# Patient Record
Sex: Male | Born: 1995 | Race: White | Hispanic: Yes | Marital: Single | Smoking: Never smoker
Health system: Southern US, Community
[De-identification: ages and names within clinical notes are randomized; demographics above are authoritative.]

---

## 2021-03-10 ENCOUNTER — Encounter (HOSPITAL_COMMUNITY): Admission: EM | Disposition: A | Discharge status: Home / Self Care | Payer: Self-pay | Source: Home / Self Care

## 2021-03-10 ENCOUNTER — Emergency Department (HOSPITAL_COMMUNITY): Payer: Self-pay

## 2021-03-10 ENCOUNTER — Emergency Department (HOSPITAL_COMMUNITY): Payer: Self-pay | Admitting: Registered Nurse

## 2021-03-10 ENCOUNTER — Other Ambulatory Visit: Payer: Self-pay

## 2021-03-10 ENCOUNTER — Observation Stay (HOSPITAL_COMMUNITY)
Admission: EM | Admit: 2021-03-10 | Discharge: 2021-03-10 | Disposition: A | Discharge status: Home / Self Care | Payer: Self-pay | Attending: General Surgery | Admitting: General Surgery

## 2021-03-10 ENCOUNTER — Encounter (HOSPITAL_COMMUNITY): Payer: Self-pay | Admitting: *Deleted

## 2021-03-10 DIAGNOSIS — S0190XA Unspecified open wound of unspecified part of head, initial encounter: Secondary | ICD-10-CM | POA: Insufficient documentation

## 2021-03-10 DIAGNOSIS — Y900 Blood alcohol level of less than 20 mg/100 ml: Secondary | ICD-10-CM | POA: Insufficient documentation

## 2021-03-10 DIAGNOSIS — S61412A Laceration without foreign body of left hand, initial encounter: Secondary | ICD-10-CM | POA: Insufficient documentation

## 2021-03-10 DIAGNOSIS — S1190XA Unspecified open wound of unspecified part of neck, initial encounter: Principal | ICD-10-CM | POA: Insufficient documentation

## 2021-03-10 DIAGNOSIS — Z23 Encounter for immunization: Secondary | ICD-10-CM | POA: Insufficient documentation

## 2021-03-10 DIAGNOSIS — Z20822 Contact with and (suspected) exposure to covid-19: Secondary | ICD-10-CM | POA: Insufficient documentation

## 2021-03-10 DIAGNOSIS — T148XXA Other injury of unspecified body region, initial encounter: Secondary | ICD-10-CM

## 2021-03-10 HISTORY — PX: EXCISION MASS NECK: SHX6703

## 2021-03-10 LAB — POCT I-STAT 7, (LYTES, BLD GAS, ICA,H+H)
Acid-base deficit: 7 mmol/L — ABNORMAL HIGH (ref 0.0–2.0)
Bicarbonate: 18.2 mmol/L — ABNORMAL LOW (ref 20.0–28.0)
Calcium, Ion: 0.98 mmol/L — ABNORMAL LOW (ref 1.15–1.40)
HCT: 22 % — ABNORMAL LOW (ref 39.0–52.0)
Hemoglobin: 7.5 g/dL — ABNORMAL LOW (ref 13.0–17.0)
O2 Saturation: 100 %
Patient temperature: 35.2
Potassium: 4.1 mmol/L (ref 3.5–5.1)
Sodium: 142 mmol/L (ref 135–145)
TCO2: 19 mmol/L — ABNORMAL LOW (ref 22–32)
pCO2 arterial: 29.8 mmHg — ABNORMAL LOW (ref 32.0–48.0)
pH, Arterial: 7.386 (ref 7.350–7.450)
pO2, Arterial: 327 mmHg — ABNORMAL HIGH (ref 83.0–108.0)

## 2021-03-10 LAB — CBC
HCT: 40.8 % (ref 39.0–52.0)
HCT: 36.3 % — ABNORMAL LOW (ref 39.0–52.0)
HCT: 38.9 % — ABNORMAL LOW (ref 39.0–52.0)
Hemoglobin: 13.9 g/dL (ref 13.0–17.0)
Hemoglobin: 12.6 g/dL — ABNORMAL LOW (ref 13.0–17.0)
Hemoglobin: 13.9 g/dL (ref 13.0–17.0)
MCH: 31 pg (ref 26.0–34.0)
MCH: 31.6 pg (ref 26.0–34.0)
MCH: 31.5 pg (ref 26.0–34.0)
MCHC: 34.1 g/dL (ref 30.0–36.0)
MCHC: 34.7 g/dL (ref 30.0–36.0)
MCHC: 35.7 g/dL (ref 30.0–36.0)
MCV: 90.9 fL (ref 80.0–100.0)
MCV: 91 fL (ref 80.0–100.0)
MCV: 88.2 fL (ref 80.0–100.0)
Platelets: 284 10*3/uL (ref 150–400)
Platelets: 182 10*3/uL (ref 150–400)
Platelets: 205 10*3/uL (ref 150–400)
RBC: 4.49 MIL/uL (ref 4.22–5.81)
RBC: 3.99 MIL/uL — ABNORMAL LOW (ref 4.22–5.81)
RBC: 4.41 MIL/uL (ref 4.22–5.81)
RDW: 11.6 % (ref 11.5–15.5)
RDW: 12 % (ref 11.5–15.5)
RDW: 12 % (ref 11.5–15.5)
WBC: 11 10*3/uL — ABNORMAL HIGH (ref 4.0–10.5)
WBC: 17.8 10*3/uL — ABNORMAL HIGH (ref 4.0–10.5)
WBC: 13.5 10*3/uL — ABNORMAL HIGH (ref 4.0–10.5)
nRBC: 0 % (ref 0.0–0.2)
nRBC: 0 % (ref 0.0–0.2)
nRBC: 0 % (ref 0.0–0.2)

## 2021-03-10 LAB — COMPREHENSIVE METABOLIC PANEL
ALT: 14 U/L (ref 0–44)
AST: 22 U/L (ref 15–41)
Albumin: 4 g/dL (ref 3.5–5.0)
Alkaline Phosphatase: 70 U/L (ref 38–126)
Anion gap: 16 — ABNORMAL HIGH (ref 5–15)
BUN: 8 mg/dL (ref 6–20)
CO2: 17 mmol/L — ABNORMAL LOW (ref 22–32)
Calcium: 8.4 mg/dL — ABNORMAL LOW (ref 8.9–10.3)
Chloride: 107 mmol/L (ref 98–111)
Creatinine, Ser: 0.98 mg/dL (ref 0.61–1.24)
GFR, Estimated: >60 mL/min (ref >60)
Glucose, Bld: 164 mg/dL — ABNORMAL HIGH (ref 70–99)
Potassium: 3.2 mmol/L — ABNORMAL LOW (ref 3.5–5.1)
Sodium: 140 mmol/L (ref 135–145)
Total Bilirubin: 0.7 mg/dL (ref 0.3–1.2)
Total Protein: 6.9 g/dL (ref 6.5–8.1)

## 2021-03-10 LAB — TRAUMA TEG PANEL
CFF Max Amplitude: 19.6 mm (ref 15–32)
Citrated Kaolin (R): 3.9 min — ABNORMAL LOW (ref 4.6–9.1)
Citrated Rapid TEG (MA): 60.6 mm (ref 52–70)
Lysis at 30 Minutes: 0.1 % (ref 0.0–2.6)

## 2021-03-10 LAB — RESP PANEL BY RT-PCR (FLU A&B, COVID) ARPGX2
Influenza A by PCR: NEGATIVE
Influenza B by PCR: NEGATIVE
SARS Coronavirus 2 by RT PCR: NEGATIVE

## 2021-03-10 LAB — ABO/RH: ABO/RH(D): O POS

## 2021-03-10 LAB — I-STAT CHEM 8, ED
BUN: 8 mg/dL (ref 6–20)
Calcium, Ion: 1.01 mmol/L — ABNORMAL LOW (ref 1.15–1.40)
Chloride: 104 mmol/L (ref 98–111)
Creatinine, Ser: 1.1 mg/dL (ref 0.61–1.24)
Glucose, Bld: 164 mg/dL — ABNORMAL HIGH (ref 70–99)
HCT: 41 % (ref 39.0–52.0)
Hemoglobin: 13.9 g/dL (ref 13.0–17.0)
Potassium: 3.1 mmol/L — ABNORMAL LOW (ref 3.5–5.1)
Sodium: 141 mmol/L (ref 135–145)
TCO2: 21 mmol/L — ABNORMAL LOW (ref 22–32)

## 2021-03-10 LAB — SAMPLE TO BLOOD BANK

## 2021-03-10 LAB — PROTIME-INR
INR: 1.2 (ref 0.8–1.2)
Prothrombin Time: 15.4 seconds — ABNORMAL HIGH (ref 11.4–15.2)

## 2021-03-10 LAB — HIV ANTIBODY (ROUTINE TESTING W REFLEX): HIV Screen 4th Generation wRfx: NONREACTIVE

## 2021-03-10 LAB — LACTIC ACID, PLASMA: Lactic Acid, Venous: 3 mmol/L (ref 0.5–1.9)

## 2021-03-10 LAB — CREATININE, SERUM
Creatinine, Ser: 0.79 mg/dL (ref 0.61–1.24)
GFR, Estimated: >60 mL/min (ref >60)

## 2021-03-10 LAB — ETHANOL: Alcohol, Ethyl (B): 317 mg/dL (ref <10)

## 2021-03-10 SURGERY — EXCISION, MASS, NECK
Anesthesia: General | Site: Neck | Laterality: Left

## 2021-03-10 MED ORDER — LACTATED RINGERS IV SOLN: INTRAVENOUS | Status: DC | PRN

## 2021-03-10 MED ORDER — LIDOCAINE 2% (20 MG/ML) 5 ML SYRINGE
INTRAMUSCULAR | Status: AC
  Filled 2021-03-10: qty 5

## 2021-03-10 MED ORDER — MORPHINE SULFATE (PF) 2 MG/ML IV SOLN: 2.0000 mg | INTRAVENOUS | Status: DC | PRN

## 2021-03-10 MED ORDER — PROPOFOL 10 MG/ML IV BOLUS
INTRAVENOUS | Status: AC
  Filled 2021-03-10: qty 20

## 2021-03-10 MED ORDER — BACITRACIN-NEOMYCIN-POLYMYXIN OINTMENT TUBE
TOPICAL_OINTMENT | CUTANEOUS | Status: AC
  Filled 2021-03-10: qty 14.17

## 2021-03-10 MED ORDER — ROCURONIUM BROMIDE 10 MG/ML (PF) SYRINGE
PREFILLED_SYRINGE | INTRAVENOUS | Status: AC
  Filled 2021-03-10: qty 10

## 2021-03-10 MED ORDER — FENTANYL CITRATE (PF) 250 MCG/5ML IJ SOLN
INTRAMUSCULAR | Status: DC | PRN
  Administered 2021-03-10: 100 ug via INTRAVENOUS
  Administered 2021-03-10: 50 ug via INTRAVENOUS

## 2021-03-10 MED ORDER — PHENYLEPHRINE 40 MCG/ML (10ML) SYRINGE FOR IV PUSH (FOR BLOOD PRESSURE SUPPORT)
PREFILLED_SYRINGE | INTRAVENOUS | Status: AC
  Filled 2021-03-10: qty 10

## 2021-03-10 MED ORDER — FENTANYL CITRATE (PF) 250 MCG/5ML IJ SOLN
INTRAMUSCULAR | Status: AC
  Filled 2021-03-10: qty 5

## 2021-03-10 MED ORDER — IOHEXOL 350 MG/ML SOLN
75.0000 mL | Freq: Once | INTRAVENOUS | Status: AC | PRN
  Administered 2021-03-10: 75 mL via INTRAVENOUS

## 2021-03-10 MED ORDER — SUCCINYLCHOLINE CHLORIDE 200 MG/10ML IV SOSY
PREFILLED_SYRINGE | INTRAVENOUS | Status: DC | PRN
  Administered 2021-03-10: 120 mg via INTRAVENOUS

## 2021-03-10 MED ORDER — DOCUSATE SODIUM 100 MG PO CAPS: 100.0000 mg | ORAL_CAPSULE | Freq: Two times a day (BID) | ORAL | Status: DC

## 2021-03-10 MED ORDER — SODIUM CHLORIDE 0.9 % IV SOLN: INTRAVENOUS | Status: DC | PRN

## 2021-03-10 MED ORDER — LIDOCAINE 2% (20 MG/ML) 5 ML SYRINGE
INTRAMUSCULAR | Status: DC | PRN
  Administered 2021-03-10: 60 mg via INTRAVENOUS

## 2021-03-10 MED ORDER — DEXAMETHASONE SODIUM PHOSPHATE 10 MG/ML IJ SOLN
INTRAMUSCULAR | Status: DC | PRN
  Administered 2021-03-10: 10 mg via INTRAVENOUS

## 2021-03-10 MED ORDER — BACITRACIN-NEOMYCIN-POLYMYXIN OINTMENT TUBE
TOPICAL_OINTMENT | CUTANEOUS | Status: DC | PRN
  Administered 2021-03-10: 1 via TOPICAL

## 2021-03-10 MED ORDER — SUCCINYLCHOLINE CHLORIDE 200 MG/10ML IV SOSY
PREFILLED_SYRINGE | INTRAVENOUS | Status: AC
  Filled 2021-03-10: qty 10

## 2021-03-10 MED ORDER — PHENYLEPHRINE 40 MCG/ML (10ML) SYRINGE FOR IV PUSH (FOR BLOOD PRESSURE SUPPORT)
PREFILLED_SYRINGE | INTRAVENOUS | Status: DC | PRN
  Administered 2021-03-10 (×8): 80 ug via INTRAVENOUS

## 2021-03-10 MED ORDER — ALBUMIN HUMAN 5 % IV SOLN: INTRAVENOUS | Status: DC | PRN

## 2021-03-10 MED ORDER — 0.9 % SODIUM CHLORIDE (POUR BTL) OPTIME
TOPICAL | Status: DC | PRN
  Administered 2021-03-10: 1000 mL

## 2021-03-10 MED ORDER — TETANUS-DIPHTH-ACELL PERTUSSIS 5-2.5-18.5 LF-MCG/0.5 IM SUSY
0.5000 mL | PREFILLED_SYRINGE | Freq: Once | INTRAMUSCULAR | Status: AC
  Administered 2021-03-10: 0.5 mL via INTRAMUSCULAR

## 2021-03-10 MED ORDER — CEFAZOLIN SODIUM-DEXTROSE 2-4 GM/100ML-% IV SOLN
2.0000 g | Freq: Once | INTRAVENOUS | Status: AC
  Administered 2021-03-10: 2 g via INTRAVENOUS
  Filled 2021-03-10: qty 100

## 2021-03-10 MED ORDER — PROPOFOL 10 MG/ML IV BOLUS
INTRAVENOUS | Status: DC | PRN
  Administered 2021-03-10: 150 mg via INTRAVENOUS

## 2021-03-10 MED ORDER — ONDANSETRON HCL 4 MG/2ML IJ SOLN
INTRAMUSCULAR | Status: DC | PRN
  Administered 2021-03-10: 4 mg via INTRAVENOUS

## 2021-03-10 MED ORDER — ACETAMINOPHEN 325 MG PO TABS: 650.0000 mg | ORAL_TABLET | ORAL | Status: DC | PRN

## 2021-03-10 MED ORDER — DEXTROSE-NACL 5-0.45 % IV SOLN: INTRAVENOUS | Status: DC

## 2021-03-10 MED ORDER — PROMETHAZINE HCL 25 MG/ML IJ SOLN: 6.2500 mg | INTRAMUSCULAR | Status: DC | PRN

## 2021-03-10 MED ORDER — HYDROMORPHONE HCL 1 MG/ML IJ SOLN: 0.2500 mg | INTRAMUSCULAR | Status: DC | PRN

## 2021-03-10 MED ORDER — MIDAZOLAM HCL 2 MG/2ML IJ SOLN
INTRAMUSCULAR | Status: AC
  Filled 2021-03-10: qty 2

## 2021-03-10 MED ORDER — ROCURONIUM BROMIDE 10 MG/ML (PF) SYRINGE
PREFILLED_SYRINGE | INTRAVENOUS | Status: DC | PRN
  Administered 2021-03-10: 50 mg via INTRAVENOUS

## 2021-03-10 MED ORDER — SUGAMMADEX SODIUM 200 MG/2ML IV SOLN
INTRAVENOUS | Status: DC | PRN
  Administered 2021-03-10: 200 mg via INTRAVENOUS
  Administered 2021-03-10: 100 mg via INTRAVENOUS

## 2021-03-10 MED ORDER — MEPERIDINE HCL 25 MG/ML IJ SOLN: 6.2500 mg | INTRAMUSCULAR | Status: DC | PRN

## 2021-03-10 MED ORDER — ENOXAPARIN SODIUM 30 MG/0.3ML IJ SOSY: 30.0000 mg | PREFILLED_SYRINGE | Freq: Two times a day (BID) | INTRAMUSCULAR | Status: DC

## 2021-03-10 MED ORDER — EPHEDRINE 5 MG/ML INJ
INTRAVENOUS | Status: AC
  Filled 2021-03-10: qty 10

## 2021-03-10 MED ORDER — ONDANSETRON HCL 4 MG/2ML IJ SOLN: 4.0000 mg | Freq: Four times a day (QID) | INTRAMUSCULAR | Status: DC | PRN

## 2021-03-10 MED ORDER — ALBUTEROL SULFATE HFA 108 (90 BASE) MCG/ACT IN AERS
INHALATION_SPRAY | RESPIRATORY_TRACT | Status: DC | PRN
  Administered 2021-03-10: 4 via RESPIRATORY_TRACT

## 2021-03-10 MED ORDER — ONDANSETRON 4 MG PO TBDP: 4.0000 mg | ORAL_TABLET | Freq: Four times a day (QID) | ORAL | Status: DC | PRN

## 2021-03-10 MED ORDER — MIDAZOLAM HCL 5 MG/5ML IJ SOLN
INTRAMUSCULAR | Status: DC | PRN
  Administered 2021-03-10: 2 mg via INTRAVENOUS

## 2021-03-10 SURGICAL SUPPLY — 45 items
BLADE CLIPPER SURG (BLADE) IMPLANT
BNDG ELASTIC 3X5.8 VLCR STR LF (GAUZE/BANDAGES/DRESSINGS) ×2 IMPLANT
BNDG ELASTIC 4X5.8 VLCR STR LF (GAUZE/BANDAGES/DRESSINGS) ×2 IMPLANT
BNDG GAUZE ELAST 4 BULKY (GAUZE/BANDAGES/DRESSINGS) ×2 IMPLANT
CHLORAPREP W/TINT 26 (MISCELLANEOUS) IMPLANT
COVER SURGICAL LIGHT HANDLE (MISCELLANEOUS) ×2 IMPLANT
COVER WAND RF STERILE (DRAPES) ×2 IMPLANT
DERMABOND ADVANCED (GAUZE/BANDAGES/DRESSINGS) ×1
DERMABOND ADVANCED .7 DNX12 (GAUZE/BANDAGES/DRESSINGS) ×1 IMPLANT
DRAPE EXTREMITY ABCS (DRAPES) ×2 IMPLANT
DRAPE HALF SHEET 40X57 (DRAPES) ×2 IMPLANT
DRAPE LAPAROTOMY 100X72 PEDS (DRAPES) ×2 IMPLANT
DRSG TEGADERM 2-3/8X2-3/4 SM (GAUZE/BANDAGES/DRESSINGS) ×2 IMPLANT
DRSG TEGADERM 4X4.75 (GAUZE/BANDAGES/DRESSINGS) ×2 IMPLANT
ELECT REM PT RETURN 9FT ADLT (ELECTROSURGICAL) ×2
ELECTRODE REM PT RTRN 9FT ADLT (ELECTROSURGICAL) ×1 IMPLANT
GAUZE SPONGE 4X4 12PLY STRL (GAUZE/BANDAGES/DRESSINGS) ×2 IMPLANT
GAUZE XEROFORM 1X8 LF (GAUZE/BANDAGES/DRESSINGS) ×2 IMPLANT
GLOVE SURG SS PI 7.0 STRL IVOR (GLOVE) ×4 IMPLANT
GLOVE SURG UNDER POLY LF SZ7 (GLOVE) ×2 IMPLANT
GOWN STRL REUS W/ TWL LRG LVL3 (GOWN DISPOSABLE) ×2 IMPLANT
GOWN STRL REUS W/TWL LRG LVL3 (GOWN DISPOSABLE) ×2
KIT BASIN OR (CUSTOM PROCEDURE TRAY) ×2 IMPLANT
KIT TURNOVER KIT B (KITS) ×2 IMPLANT
NDL HYPO 25GX1X1/2 BEV (NEEDLE) ×1 IMPLANT
NEEDLE 22X1 1/2 (OR ONLY) (NEEDLE) IMPLANT
NEEDLE HYPO 25GX1X1/2 BEV (NEEDLE) ×2 IMPLANT
NS IRRIG 1000ML POUR BTL (IV SOLUTION) ×2 IMPLANT
PACK EENT II TURBAN DRAPE (CUSTOM PROCEDURE TRAY) ×2 IMPLANT
PACK GENERAL/GYN (CUSTOM PROCEDURE TRAY) ×4 IMPLANT
PAD ARMBOARD 7.5X6 YLW CONV (MISCELLANEOUS) ×4 IMPLANT
PENCIL SMOKE EVACUATOR (MISCELLANEOUS) IMPLANT
SPECIMEN JAR SMALL (MISCELLANEOUS) ×2 IMPLANT
STAPLER VISISTAT (STAPLE) ×2 IMPLANT
SUT CHROMIC 4 0 PS 2 18 (SUTURE) ×2 IMPLANT
SUT MNCRL AB 4-0 PS2 18 (SUTURE) IMPLANT
SUT SILK 3 0 SH CR/8 (SUTURE) ×2 IMPLANT
SUT VIC AB 2-0 SH 27 (SUTURE)
SUT VIC AB 2-0 SH 27X BRD (SUTURE) IMPLANT
SUT VIC AB 3-0 SH 27 (SUTURE)
SUT VIC AB 3-0 SH 27XBRD (SUTURE) IMPLANT
SUT VIC AB 3-0 SH 8-18 (SUTURE) ×2 IMPLANT
SYR CONTROL 10ML LL (SYRINGE) IMPLANT
TOWEL GREEN STERILE (TOWEL DISPOSABLE) ×2 IMPLANT
TOWEL GREEN STERILE FF (TOWEL DISPOSABLE) ×2 IMPLANT

## 2021-03-10 NOTE — Anesthesia Procedure Notes (Signed)
Arterial Line Insertion Start/End5/14/2022 1:25 AM, 03/10/2021 1:30 AM Performed by: Zollie Scale, CRNA, CRNA  Patient location: OR. Emergency situation Right, radial was placed Catheter size: 20 G Hand hygiene performed  and Seldinger technique used Allen's test indicative of satisfactory collateral circulation Attempts: 1 Procedure performed without using ultrasound guided technique. Following insertion, dressing applied and Biopatch. Patient tolerated the procedure well with no immediate complications.
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
## 2021-03-10 NOTE — ED Notes (Signed)
bp 116/82

## 2021-03-10 NOTE — Discharge Instructions (Signed)
Use tylenol and ibuprofen for pain May shower on Sunday and get wounds wet  Herida por puncin Puncture Wound Una herida por puncin es una lesin causada por un objeto delgado y filoso que atraviesa (penetra) la piel. Por lo general, las heridas por puncin no dejan una gran abertura en la piel, por lo que es posible que no sangren mucho. Sin embargo, cuando una persona tiene una herida por puncin, la suciedad u otros materiales (cuerpos extraos) puede trasladarse al interior de la herida y desprenderse all. Esto aumenta la probabilidad de sufrir una infeccin, como el ttanos. Existen muchos objetos filosos y puntiagudos que pueden causar heridas por puncin, como dientes, uas, esquirlas de vidrio, anzuelos y [place]. El tratamiento puede incluir lavar la herida con un una solucin de agua y sal libre de grmenes (estril), abrirla quirrgicamente para retirar un objeto extrao, cerrarla con puntos (suturas) y [place] con un ungento antibitico y Neomia Dear venda (vendaje). Segn la causa de la lesin, es posible que tambin sea necesario aplicar la vacuna antitetnica o la vacuna contra la rabia. Siga estas indicaciones en su casa: Medicamentos  Tome o aplquese los medicamentos de venta libre y los recetados solamente como se lo haya indicado el mdico.  Si le recetaron un antibitico, tmelo o aplqueselo como se lo haya indicado el mdico. No deje de usar el antibitico aunque la afeccin mejore. Baos  Mantenga el vendaje seco, como se lo haya indicado el mdico.  No tome baos de inmersin, no nade ni use el jacuzzi hasta que el mdico lo autorice. Pregntele al mdico si puede ducharse. Delle Reining solo le permitan darse baos de [place]. Cuidados de la herida  Existen muchas maneras de cerrar y Leonia Reeves herida. Por ejemplo, una herida se puede cerrar con suturas, goma para cerrar la piel o tiras [id number]. Siga las indicaciones del mdico acerca del cuidado de la herida. Asegrese de hacer  lo siguiente: ? Lvese las manos con agua y [place] antes y despus de cambiar el vendaje. Use desinfectante para manos si no dispone de [place] y [place]. ? Cambie el vendaje como se lo haya indicado el mdico. ? No retire las suturas, la goma para Optician, dispensing piel o las tiras [place]. Es posible que estos cierres cutneos Conservation officer, nature en la piel durante 2semanas o ms tiempo. Si los bordes de las tiras [address] empiezan a despegarse y Scientific laboratory technician, puede recortar los que estn sueltos. No retire las tiras [employer] por completo a menos que el mdico se lo indique.  Limpie la herida como se lo haya indicado el mdico.  No se rasque ni se toque la herida.  Controle la herida [employer] para detectar signos de infeccin. Est atento a los siguientes signos: ? Enrojecimiento, hinchazn o dolor. ? Lquido o sangre. ? Calor. ? Pus o mal olor.   Indicaciones generales  Cuando est sentado o acostado, levante (eleve) la zona lesionada por encima del nivel del corazn.  Si la herida por puncin est en el pie, pregntele al mdico si debe evitar apoyar [employer] y durante cunto [place]. No apoye el peso del cuerpo Intel extremidad [employer] que lo autorice el mdico. Use las [employer] se lo haya indicado el mdico.  Concurra a todas las visitas de seguimiento como se lo haya indicado el mdico. Esto es importante. Comunquese con un mdico si:  Le aplicaron la antitetnica y tiene hinchazn, dolor intenso, enrojecimiento o hemorragia en el sitio de la inyeccin.  Tiene fiebre.  Las suturas se salen.  Percibe que sale mal olor de la herida o del vendaje.  Nota un cuerpo extrao en la herida, como un trozo de [place] o vidrio.  El dolor no se alivia con los [employer].  Presenta un aumento del enrojecimiento, la hinchazn o Dentist de la herida.  Presenta lquido, sangre o pus que provienen de la herida.  Observa que la piel cerca de la herida cambia  de color.  Debe cambiar el vendaje con frecuencia debido a que hay secrecin de lquido, sangre o pus de la herida.  Presenta una nueva erupcin cutnea.  Tiene adormecimiento alrededor de la herida.  La zona que rodea la herida se siente caliente. Solicite ayuda inmediatamente si:  Tiene mucha hinchazn alrededor de la herida.  El dolor aumenta repentinamente y es intenso.  Le salen bultos dolorosos en la piel.  Tiene una lnea roja que sale de la herida.  La herida est en la mano o el pie y usted: ? No puede mover de forma adecuada un dedo. ? Nota que los dedos se ven plidos o azulados. Resumen  Una herida por puncin es una lesin causada por un objeto delgado y filoso que atraviesa (penetra) la piel.  El tratamiento puede incluir lavar la herida, abrirla quirrgicamente para retirar un objeto extrao, cerrarla con puntos (suturas) y [place] con un ungento antibitico y Neomia Dear venda (vendaje).  Siga las indicaciones del mdico acerca del cuidado de la herida.  Comunquese con un mdico si presenta un aumento del enrojecimiento, la hinchazn o Dentist de la herida.  Concurra a todas las visitas de [address] se lo haya indicado el mdico. Esto es importante. Esta informacin no tiene Theme park manager el consejo del mdico. Asegrese de hacerle al mdico cualquier pregunta que tenga. Document Revised: 06/18/2018 Document Reviewed: 06/18/2018 Elsevier Patient Education  2021 [employer].

## 2021-03-10 NOTE — Progress Notes (Signed)
RN brought belongings to bedside. Confirmed with [place] Citizen RN 841 dollars in black wallet consisting of 4 $100.00, 21 $20.00, 3 $5.00, and 6 $1.00. Also at bedside Pts cell phone and necklace and earrings.

## 2021-03-10 NOTE — ED Notes (Signed)
Portable xrays complete. 
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
## 2021-03-10 NOTE — Progress Notes (Signed)
Right radial arterial line removed per MD order, catheter intact.  Pressure held for 10 minutes and pressure dressing applied.  Site unremarkable.

## 2021-03-10 NOTE — Progress Notes (Signed)
   03/10/21 1026  OTHER  Substance Abuse Education Offered Yes  Substance abuse interventions Patient Counseling  (CAGE-AID) Substance Abuse Screening Tool  Have You Ever Felt You Ought to Cut Down on Your Drinking or Drug Use? 0  Have People Annoyed You By Critizing Your Drinking Or Drug Use? 0  Have You Felt Bad Or Guilty About Your Drinking Or Drug Use? 0  Have You Ever Had a Drink or Used Drugs First Thing In The Morning to Steady Your Nerves or to Get Rid of a Hangover? 0  CAGE-AID Score 0

## 2021-03-10 NOTE — ED Notes (Signed)
To c-r

## 2021-03-10 NOTE — Progress Notes (Signed)
Pt and Pt's girlfriend given D/C education and all questions answered. IV's removed. No printed prescriptions to give or equipment to deliver. Pt taken dow stairs to car. Ride was not here at that time. Pt stated ride would be here in just a minute RN offered to wait with Pt and his girlfriend but Pt declined.
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
## 2021-03-10 NOTE — Anesthesia Preprocedure Evaluation (Signed)
Anesthesia Evaluation    Reviewed: Allergy & Precautions, Patient's Chart, lab work & pertinent test results, Unable to perform ROS - Chart review onlyPreop documentation limited or incomplete due to emergent nature of procedure.  Airway        Dental   Pulmonary neg pulmonary ROS,           Cardiovascular negative cardio ROS       Neuro/Psych negative neurological ROS     GI/Hepatic negative GI ROS, Neg liver ROS,   Endo/Other  negative endocrine ROS  Renal/GU negative Renal ROS     Musculoskeletal negative musculoskeletal ROS (+)   Abdominal   Peds  Hematology negative hematology ROS (+)   Anesthesia Other Findings   Reproductive/Obstetrics                             Anesthesia Physical Anesthesia Plan  ASA: II and emergent  Anesthesia Plan: General   Post-op Pain Management:    Induction: Intravenous, Rapid sequence and Cricoid pressure planned  PONV Risk Score and Plan: 3 and Ondansetron, Dexamethasone, Midazolam, Treatment may vary due to age or medical condition and Diphenhydramine  Airway Management Planned: Oral ETT  Additional Equipment:   Intra-op Plan:   Post-operative Plan: Possible Post-op intubation/ventilation  Informed Consent:   Plan Discussed with:   Anesthesia Plan Comments:         Anesthesia Quick Evaluation

## 2021-03-10 NOTE — Anesthesia Procedure Notes (Signed)
Procedure Name: Intubation Date/Time: 03/10/2021 1:20 AM Performed by: Zollie Scale, CRNA Pre-anesthesia Checklist: Patient identified, Emergency Drugs available, Suction available and Patient being monitored Patient Re-evaluated:Patient Re-evaluated prior to induction Oxygen Delivery Method: Circle System Utilized Preoxygenation: Pre-oxygenation with 100% oxygen Induction Type: IV induction, Rapid sequence and Cricoid Pressure applied Laryngoscope Size: Glidescope and 4 Grade View: Grade I Tube type: Oral Tube size: 8.0 mm Number of attempts: 1 Airway Equipment and Method: Stylet and Video-laryngoscopy Placement Confirmation: ETT inserted through vocal cords under direct vision,  positive ETCO2 and breath sounds checked- equal and bilateral Secured at: 22 cm Tube secured with: Tape Dental Injury: Teeth and Oropharynx as per pre-operative assessment  Comments: Glidescope utilized d/t unknown COVID status.

## 2021-03-10 NOTE — ED Triage Notes (Signed)
To the or now 
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
## 2021-03-10 NOTE — Progress Notes (Signed)
Order to d/c foley but no foley present. Condom cath in place.
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
## 2021-03-12 ENCOUNTER — Encounter (HOSPITAL_COMMUNITY): Payer: Self-pay | Admitting: General Surgery

## 2021-03-12 LAB — BPAM FFP
Blood Product Expiration Date: 202205162359
Blood Product Expiration Date: 202205172359
ISSUE DATE / TIME: 202205140203
ISSUE DATE / TIME: 202205140203
Unit Type and Rh: 600
Unit Type and Rh: 6200

## 2021-03-12 LAB — TYPE AND SCREEN
ABO/RH(D): O POS
Antibody Screen: NEGATIVE
Unit division: 0
Unit division: 0
Unit division: 0
Unit division: 0

## 2021-03-12 LAB — BPAM RBC
Blood Product Expiration Date: 202206082359
Blood Product Expiration Date: 202206082359
Blood Product Expiration Date: 202206082359
Blood Product Expiration Date: 202206082359
ISSUE DATE / TIME: 202205140205
ISSUE DATE / TIME: 202205140205
ISSUE DATE / TIME: 202205140205
ISSUE DATE / TIME: 202205140205
Unit Type and Rh: 5100
Unit Type and Rh: 5100
Unit Type and Rh: 5100
Unit Type and Rh: 5100

## 2021-03-12 LAB — PREPARE FRESH FROZEN PLASMA: Unit division: 0
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
